# Patient Record
Sex: Female | Born: 1955 | Race: White | Hispanic: No | Marital: Married | Smoking: Current every day smoker
Health system: Southern US, Community
[De-identification: ages and names within clinical notes are randomized; demographics above are authoritative.]

## PROBLEM LIST (undated history)

## (undated) DIAGNOSIS — M1990 Unspecified osteoarthritis, unspecified site: Secondary | ICD-10-CM

## (undated) DIAGNOSIS — R011 Cardiac murmur, unspecified: Secondary | ICD-10-CM

## (undated) HISTORY — PX: CHOLECYSTECTOMY: SHX55

## (undated) HISTORY — PX: ABDOMINAL HYSTERECTOMY: SHX81

## (undated) SURGICAL SUPPLY — 35 items
BAG ZIPLOCK 12X15 (MISCELLANEOUS)
BENZOIN TINCTURE PRP APPL 2/3 (GAUZE/BANDAGES/DRESSINGS)
CLEANER TIP ELECTROSURG 2X2 (MISCELLANEOUS) ×2
DRAIN PENROSE 18X1/4 LTX STRL (WOUND CARE)
DRAPE MICROSCOPE LEICA (MISCELLANEOUS) ×2
DRAPE POUCH INSTRU U-SHP 10X18 (DRAPES) ×2
DRAPE SURG 17X11 SM STRL (DRAPES) ×2
DRSG ADAPTIC 3X8 NADH LF (GAUZE/BANDAGES/DRESSINGS) ×2
DRSG PAD ABDOMINAL 8X10 ST (GAUZE/BANDAGES/DRESSINGS) ×6
DURAPREP 26ML APPLICATOR (WOUND CARE) ×2
ELECT BLADE TIP CTD 4 INCH (ELECTRODE) ×2
ELECT REM PT RETURN 9FT ADLT (ELECTROSURGICAL) ×3
GLOVE BIOGEL PI INDICATOR 8 (GLOVE) ×2
GLOVE ECLIPSE 8.0 STRL XLNG CF (GLOVE) ×4
GLOVE SURG SS PI 8.0 STRL IVOR (GLOVE) ×2
GOWN STRL REUS W/TWL XL LVL3 (GOWN DISPOSABLE) ×4
KIT BASIN OR (CUSTOM PROCEDURE TRAY) ×2
KIT POSITIONING SURG ANDREWS (MISCELLANEOUS) ×2
MANIFOLD NEPTUNE II (INSTRUMENTS) ×2
NEEDLE SPNL 18GX3.5 QUINCKE PK (NEEDLE) ×4
NS IRRIG 1000ML POUR BTL (IV SOLUTION)
PAD ABD 8X10 STRL (GAUZE/BANDAGES/DRESSINGS) ×2
PATTIES SURGICAL .5 X.5 (GAUZE/BANDAGES/DRESSINGS)
PATTIES SURGICAL .75X.75 (GAUZE/BANDAGES/DRESSINGS)
PATTIES SURGICAL 1X1 (DISPOSABLE)
PIN SAFETY NICK PLATE  2 MED (MISCELLANEOUS)
POSITIONER SURGICAL ARM (MISCELLANEOUS) ×2
SPONGE LAP 4X18 X RAY DECT (DISPOSABLE) ×2
SPONGE SURGIFOAM ABS GEL 100 (HEMOSTASIS) ×2
STAPLER VISISTAT 35W (STAPLE) ×2
SUT VIC AB 0 CT1 27 (SUTURE) ×2
SUT VIC AB 1 CT1 27 (SUTURE) ×6
TAPE CLOTH SURG 4X10 WHT LF (GAUZE/BANDAGES/DRESSINGS) ×2
TOWEL OR 17X26 10 PK STRL BLUE (TOWEL DISPOSABLE) ×2
TRAY LAMINECTOMY (CUSTOM PROCEDURE TRAY) ×2

---

## 2011-06-19 ENCOUNTER — Ambulatory Visit (HOSPITAL_COMMUNITY): Admission: RE | Admit: 2011-06-19 | Payer: Self-pay | Source: Ambulatory Visit

## 2011-06-19 DIAGNOSIS — N281 Cyst of kidney, acquired: Secondary | ICD-10-CM | POA: Insufficient documentation

## 2011-06-19 DIAGNOSIS — K7689 Other specified diseases of liver: Secondary | ICD-10-CM | POA: Insufficient documentation

## 2011-06-19 DIAGNOSIS — M549 Dorsalgia, unspecified: Secondary | ICD-10-CM | POA: Insufficient documentation

## 2011-06-19 DIAGNOSIS — K429 Umbilical hernia without obstruction or gangrene: Secondary | ICD-10-CM | POA: Insufficient documentation

## 2011-06-19 DIAGNOSIS — Z90710 Acquired absence of both cervix and uterus: Secondary | ICD-10-CM | POA: Insufficient documentation

## 2011-06-19 DIAGNOSIS — R1031 Right lower quadrant pain: Secondary | ICD-10-CM

## 2011-06-19 DIAGNOSIS — K8020 Calculus of gallbladder without cholecystitis without obstruction: Secondary | ICD-10-CM | POA: Insufficient documentation

## 2011-06-19 DIAGNOSIS — I728 Aneurysm of other specified arteries: Secondary | ICD-10-CM | POA: Insufficient documentation

## 2011-06-19 MED ORDER — IOHEXOL 300 MG/ML  SOLN
100.0000 mL | Freq: Once | INTRAMUSCULAR | Status: AC | PRN
  Administered 2011-06-19: 100 mL via INTRAVENOUS

## 2011-06-22 ENCOUNTER — Encounter (INDEPENDENT_AMBULATORY_CARE_PROVIDER_SITE_OTHER): Payer: Self-pay | Admitting: General Surgery

## 2011-06-22 VITALS — BP 172/99 | HR 98 | Ht 63.0 in | Wt 252.0 lb

## 2011-06-22 DIAGNOSIS — K8010 Calculus of gallbladder with chronic cholecystitis without obstruction: Secondary | ICD-10-CM

## 2011-07-01 ENCOUNTER — Ambulatory Visit (INDEPENDENT_AMBULATORY_CARE_PROVIDER_SITE_OTHER): Payer: Managed Care, Other (non HMO) | Admitting: General Surgery

## 2011-07-15 ENCOUNTER — Other Ambulatory Visit (HOSPITAL_COMMUNITY): Payer: Managed Care, Other (non HMO)

## 2011-07-15 ENCOUNTER — Other Ambulatory Visit (INDEPENDENT_AMBULATORY_CARE_PROVIDER_SITE_OTHER): Payer: Self-pay | Admitting: General Surgery

## 2011-07-15 ENCOUNTER — Ambulatory Visit (HOSPITAL_COMMUNITY)
Admission: RE | Admit: 2011-07-15 | Discharge: 2011-07-15 | Disposition: A | Discharge status: Home / Self Care | Payer: Managed Care, Other (non HMO) | Source: Ambulatory Visit | Attending: General Surgery | Admitting: General Surgery

## 2011-07-15 ENCOUNTER — Encounter (HOSPITAL_COMMUNITY): Payer: Managed Care, Other (non HMO)

## 2011-07-15 DIAGNOSIS — Z01812 Encounter for preprocedural laboratory examination: Secondary | ICD-10-CM | POA: Insufficient documentation

## 2011-07-15 DIAGNOSIS — K8020 Calculus of gallbladder without cholecystitis without obstruction: Secondary | ICD-10-CM | POA: Insufficient documentation

## 2011-07-15 DIAGNOSIS — Z01818 Encounter for other preprocedural examination: Secondary | ICD-10-CM | POA: Insufficient documentation

## 2011-07-15 DIAGNOSIS — Z01810 Encounter for preprocedural cardiovascular examination: Secondary | ICD-10-CM | POA: Insufficient documentation

## 2011-07-15 DIAGNOSIS — Z9089 Acquired absence of other organs: Secondary | ICD-10-CM | POA: Insufficient documentation

## 2011-07-21 ENCOUNTER — Other Ambulatory Visit (INDEPENDENT_AMBULATORY_CARE_PROVIDER_SITE_OTHER): Payer: Self-pay | Admitting: General Surgery

## 2011-07-21 DIAGNOSIS — Z01810 Encounter for preprocedural cardiovascular examination: Secondary | ICD-10-CM | POA: Insufficient documentation

## 2011-07-21 DIAGNOSIS — K8010 Calculus of gallbladder with chronic cholecystitis without obstruction: Secondary | ICD-10-CM | POA: Insufficient documentation

## 2011-07-21 DIAGNOSIS — Z01812 Encounter for preprocedural laboratory examination: Secondary | ICD-10-CM | POA: Insufficient documentation

## 2011-07-21 DIAGNOSIS — Z90710 Acquired absence of both cervix and uterus: Secondary | ICD-10-CM | POA: Insufficient documentation

## 2011-07-21 DIAGNOSIS — Z9079 Acquired absence of other genital organ(s): Secondary | ICD-10-CM | POA: Insufficient documentation

## 2011-07-21 DIAGNOSIS — F17200 Nicotine dependence, unspecified, uncomplicated: Secondary | ICD-10-CM | POA: Insufficient documentation

## 2011-07-28 ENCOUNTER — Encounter (INDEPENDENT_AMBULATORY_CARE_PROVIDER_SITE_OTHER): Payer: Managed Care, Other (non HMO)

## 2011-07-28 ENCOUNTER — Telehealth (INDEPENDENT_AMBULATORY_CARE_PROVIDER_SITE_OTHER): Payer: Self-pay | Admitting: General Surgery

## 2011-08-09 ENCOUNTER — Encounter (INDEPENDENT_AMBULATORY_CARE_PROVIDER_SITE_OTHER): Payer: Self-pay | Admitting: General Surgery

## 2011-08-09 VITALS — BP 164/98 | HR 84 | Temp 98.0°F | Resp 18 | Ht 63.0 in | Wt 251.4 lb

## 2011-08-09 DIAGNOSIS — Z9889 Other specified postprocedural states: Secondary | ICD-10-CM

## 2014-05-14 ENCOUNTER — Other Ambulatory Visit: Payer: Self-pay | Admitting: Surgical

## 2014-05-22 ENCOUNTER — Encounter (HOSPITAL_COMMUNITY)
Admission: RE | Admit: 2014-05-22 | Discharge: 2014-05-22 | Disposition: A | Discharge status: Home / Self Care | Payer: Managed Care, Other (non HMO) | Source: Ambulatory Visit | Attending: Orthopedic Surgery | Admitting: Orthopedic Surgery

## 2014-05-22 ENCOUNTER — Ambulatory Visit (HOSPITAL_COMMUNITY)
Admission: RE | Admit: 2014-05-22 | Discharge: 2014-05-22 | Disposition: A | Discharge status: Home / Self Care | Payer: Managed Care, Other (non HMO) | Source: Ambulatory Visit | Attending: Surgical | Admitting: Surgical

## 2014-05-22 ENCOUNTER — Encounter (INDEPENDENT_AMBULATORY_CARE_PROVIDER_SITE_OTHER): Payer: Self-pay

## 2014-05-22 ENCOUNTER — Encounter (HOSPITAL_COMMUNITY): Payer: Self-pay

## 2014-05-22 DIAGNOSIS — M545 Low back pain, unspecified: Secondary | ICD-10-CM | POA: Insufficient documentation

## 2014-05-22 DIAGNOSIS — M47817 Spondylosis without myelopathy or radiculopathy, lumbosacral region: Secondary | ICD-10-CM | POA: Insufficient documentation

## 2014-05-22 DIAGNOSIS — Z01812 Encounter for preprocedural laboratory examination: Secondary | ICD-10-CM | POA: Insufficient documentation

## 2014-05-22 DIAGNOSIS — I700 Atherosclerosis of aorta: Secondary | ICD-10-CM | POA: Insufficient documentation

## 2014-05-22 DIAGNOSIS — Z01818 Encounter for other preprocedural examination: Secondary | ICD-10-CM | POA: Insufficient documentation

## 2014-05-22 HISTORY — DX: Unspecified osteoarthritis, unspecified site: M19.90

## 2014-05-22 HISTORY — DX: Cardiac murmur, unspecified: R01.1

## 2014-05-29 ENCOUNTER — Ambulatory Visit (HOSPITAL_COMMUNITY): Payer: Managed Care, Other (non HMO) | Admitting: Certified Registered Nurse Anesthetist

## 2014-05-29 ENCOUNTER — Ambulatory Visit (HOSPITAL_COMMUNITY): Payer: Managed Care, Other (non HMO)

## 2014-05-29 ENCOUNTER — Encounter (HOSPITAL_COMMUNITY)
Admission: RE | Disposition: A | Discharge status: Home / Self Care | Payer: Self-pay | Source: Ambulatory Visit | Attending: Orthopedic Surgery

## 2014-05-29 ENCOUNTER — Encounter (HOSPITAL_COMMUNITY): Payer: Self-pay | Admitting: *Deleted

## 2014-05-29 ENCOUNTER — Encounter (HOSPITAL_COMMUNITY): Payer: Managed Care, Other (non HMO) | Admitting: Certified Registered Nurse Anesthetist

## 2014-05-29 DIAGNOSIS — M069 Rheumatoid arthritis, unspecified: Secondary | ICD-10-CM | POA: Insufficient documentation

## 2014-05-29 DIAGNOSIS — F17200 Nicotine dependence, unspecified, uncomplicated: Secondary | ICD-10-CM | POA: Insufficient documentation

## 2014-05-29 DIAGNOSIS — M5126 Other intervertebral disc displacement, lumbar region: Secondary | ICD-10-CM

## 2014-05-29 HISTORY — PX: HEMI-MICRODISCECTOMY LUMBAR LAMINECTOMY LEVEL 1: SHX5846

## 2014-05-29 MED ORDER — GLYCOPYRROLATE 0.2 MG/ML IJ SOLN
INTRAMUSCULAR | Status: DC | PRN
  Administered 2014-05-29: .1 mg via INTRAVENOUS

## 2014-05-29 MED ORDER — POLYETHYLENE GLYCOL 3350 17 G PO PACK: 17.0000 g | PACK | Freq: Every day | ORAL | Status: DC | PRN

## 2014-05-29 MED ORDER — PHENOL 1.4 % MT LIQD: 1.0000 | OROMUCOSAL | Status: DC | PRN

## 2014-05-29 MED ORDER — SUCCINYLCHOLINE CHLORIDE 20 MG/ML IJ SOLN
INTRAMUSCULAR | Status: DC | PRN
  Administered 2014-05-29: 100 mg via INTRAVENOUS

## 2014-05-29 MED ORDER — THROMBIN 5000 UNITS EX SOLR
OROMUCOSAL | Status: DC | PRN
  Administered 2014-05-29: 08:00:00 via TOPICAL

## 2014-05-29 MED ORDER — FENTANYL CITRATE 0.05 MG/ML IJ SOLN: INTRAMUSCULAR | Status: AC

## 2014-05-29 MED ORDER — ACETAMINOPHEN 10 MG/ML IV SOLN: 1000.0000 mg | Freq: Once | INTRAVENOUS | Status: DC

## 2014-05-29 MED ORDER — DEXAMETHASONE SODIUM PHOSPHATE 10 MG/ML IJ SOLN: INTRAMUSCULAR | Status: AC

## 2014-05-29 MED ORDER — HYDROMORPHONE HCL PF 1 MG/ML IJ SOLN: INTRAMUSCULAR | Status: AC

## 2014-05-29 MED ORDER — ATROPINE SULFATE 0.4 MG/ML IJ SOLN: INTRAMUSCULAR | Status: AC

## 2014-05-29 MED ORDER — OXYCODONE-ACETAMINOPHEN 5-325 MG PO TABS
1.0000 | ORAL_TABLET | ORAL | Status: DC | PRN
  Administered 2014-05-29: 1 via ORAL

## 2014-05-29 MED ORDER — LACTATED RINGERS IV SOLN
INTRAVENOUS | Status: DC
  Administered 2014-05-29 (×3): via INTRAVENOUS

## 2014-05-29 MED ORDER — EPHEDRINE SULFATE 50 MG/ML IJ SOLN: INTRAMUSCULAR | Status: AC

## 2014-05-29 MED ORDER — BACITRACIN-NEOMYCIN-POLYMYXIN 400-5-5000 EX OINT
TOPICAL_OINTMENT | CUTANEOUS | Status: DC | PRN
  Administered 2014-05-29: 1 via TOPICAL

## 2014-05-29 MED ORDER — CISATRACURIUM BESYLATE 20 MG/10ML IV SOLN: INTRAVENOUS | Status: AC

## 2014-05-29 MED ORDER — MIDAZOLAM HCL 2 MG/2ML IJ SOLN: INTRAMUSCULAR | Status: AC

## 2014-05-29 MED ORDER — FLEET ENEMA 7-19 GM/118ML RE ENEM: 1.0000 | ENEMA | Freq: Once | RECTAL | Status: AC | PRN

## 2014-05-29 MED ORDER — NEOSTIGMINE METHYLSULFATE 10 MG/10ML IV SOLN: INTRAVENOUS | Status: AC

## 2014-05-29 MED ORDER — ONDANSETRON HCL 4 MG/2ML IJ SOLN: 4.0000 mg | INTRAMUSCULAR | Status: DC | PRN

## 2014-05-29 MED ORDER — ACETAMINOPHEN 650 MG RE SUPP: 650.0000 mg | RECTAL | Status: DC | PRN

## 2014-05-29 MED ORDER — SODIUM CHLORIDE 0.9 % IR SOLN
Status: DC | PRN
  Administered 2014-05-29: 08:00:00

## 2014-05-29 MED ORDER — BUPIVACAINE-EPINEPHRINE (PF) 0.5% -1:200000 IJ SOLN: INTRAMUSCULAR | Status: AC

## 2014-05-29 MED ORDER — ESMOLOL HCL 10 MG/ML IV SOLN
INTRAVENOUS | Status: DC | PRN
  Administered 2014-05-29: 5 mg via INTRAVENOUS

## 2014-05-29 MED ORDER — BISACODYL 5 MG PO TBEC: 5.0000 mg | DELAYED_RELEASE_TABLET | Freq: Every day | ORAL | Status: DC | PRN

## 2014-05-29 MED ORDER — CISATRACURIUM BESYLATE (PF) 10 MG/5ML IV SOLN
INTRAVENOUS | Status: DC | PRN
  Administered 2014-05-29: 8 mg via INTRAVENOUS

## 2014-05-29 MED ORDER — MIDAZOLAM HCL 5 MG/5ML IJ SOLN
INTRAMUSCULAR | Status: DC | PRN
  Administered 2014-05-29 (×2): 1 mg via INTRAVENOUS

## 2014-05-29 MED ORDER — CEFAZOLIN SODIUM-DEXTROSE 2-3 GM-% IV SOLR
2.0000 g | INTRAVENOUS | Status: AC
  Administered 2014-05-29: 2 g via INTRAVENOUS

## 2014-05-29 MED ORDER — BACITRACIN-NEOMYCIN-POLYMYXIN 400-5-5000 EX OINT: TOPICAL_OINTMENT | CUTANEOUS | Status: AC

## 2014-05-29 MED ORDER — PHENYLEPHRINE HCL 10 MG/ML IJ SOLN
INTRAMUSCULAR | Status: DC | PRN
  Administered 2014-05-29: 80 ug via INTRAVENOUS
  Administered 2014-05-29: 40 ug via INTRAVENOUS
  Administered 2014-05-29: 80 ug via INTRAVENOUS

## 2014-05-29 MED ORDER — HYDROMORPHONE HCL PF 1 MG/ML IJ SOLN: 0.5000 mg | INTRAMUSCULAR | Status: DC | PRN

## 2014-05-29 MED ORDER — PROMETHAZINE HCL 25 MG/ML IJ SOLN: 6.2500 mg | INTRAMUSCULAR | Status: DC | PRN

## 2014-05-29 MED ORDER — ONDANSETRON HCL 4 MG/2ML IJ SOLN
INTRAMUSCULAR | Status: DC | PRN
  Administered 2014-05-29 (×2): 2 mg via INTRAVENOUS

## 2014-05-29 MED ORDER — MENTHOL 3 MG MT LOZG: 1.0000 | LOZENGE | OROMUCOSAL | Status: DC | PRN

## 2014-05-29 MED ORDER — SODIUM CHLORIDE 0.9 % IV SOLN: 10.0000 mg | INTRAVENOUS | Status: DC | PRN

## 2014-05-29 MED ORDER — HYDROMORPHONE HCL PF 1 MG/ML IJ SOLN
0.2500 mg | INTRAMUSCULAR | Status: DC | PRN
  Administered 2014-05-29 (×3): 0.5 mg via INTRAVENOUS

## 2014-05-29 MED ORDER — NEOSTIGMINE METHYLSULFATE 10 MG/10ML IV SOLN
INTRAVENOUS | Status: DC | PRN
  Administered 2014-05-29: 4 mg via INTRAVENOUS

## 2014-05-29 MED ORDER — METHOCARBAMOL 500 MG PO TABS
500.0000 mg | ORAL_TABLET | Freq: Four times a day (QID) | ORAL | Status: DC | PRN
  Administered 2014-05-30: 500 mg via ORAL

## 2014-05-29 MED ORDER — ACETAMINOPHEN 325 MG PO TABS: 650.0000 mg | ORAL_TABLET | ORAL | Status: DC | PRN

## 2014-05-29 MED ORDER — BUPIVACAINE LIPOSOME 1.3 % IJ SUSP
20.0000 mL | Freq: Once | INTRAMUSCULAR | Status: AC
  Administered 2014-05-29: 20 mL

## 2014-05-29 MED ORDER — LACTATED RINGERS IV SOLN
INTRAVENOUS | Status: DC
  Administered 2014-05-29: 100 mL/h via INTRAVENOUS

## 2014-05-29 MED ORDER — ONDANSETRON HCL 4 MG/2ML IJ SOLN: INTRAMUSCULAR | Status: AC

## 2014-05-29 MED ORDER — GLYCOPYRROLATE 0.2 MG/ML IJ SOLN: INTRAMUSCULAR | Status: AC

## 2014-05-29 MED ORDER — METHOCARBAMOL 1000 MG/10ML IJ SOLN
500.0000 mg | Freq: Four times a day (QID) | INTRAVENOUS | Status: DC | PRN
  Administered 2014-05-29: 500 mg via INTRAVENOUS

## 2014-05-29 MED ORDER — PROPOFOL 10 MG/ML IV BOLUS
INTRAVENOUS | Status: DC | PRN
  Administered 2014-05-29: 180 mg via INTRAVENOUS
  Administered 2014-05-29: 20 mg via INTRAVENOUS

## 2014-05-29 MED ORDER — LIDOCAINE HCL (CARDIAC) 20 MG/ML IV SOLN: INTRAVENOUS | Status: AC

## 2014-05-29 MED ORDER — THROMBIN 5000 UNITS EX SOLR: CUTANEOUS | Status: AC

## 2014-05-29 MED ORDER — THROMBIN 5000 UNITS EX SOLR
CUTANEOUS | Status: DC | PRN
  Administered 2014-05-29: 5000 [IU] via TOPICAL

## 2014-05-29 MED ORDER — LIDOCAINE HCL (CARDIAC) 20 MG/ML IV SOLN
INTRAVENOUS | Status: DC | PRN
  Administered 2014-05-29: 100 mg via INTRAVENOUS

## 2014-05-29 MED ORDER — ACETAMINOPHEN 10 MG/ML IV SOLN
INTRAVENOUS | Status: DC | PRN
  Administered 2014-05-29: 1000 mg via INTRAVENOUS

## 2014-05-29 MED ORDER — DEXAMETHASONE SODIUM PHOSPHATE 10 MG/ML IJ SOLN
INTRAMUSCULAR | Status: DC | PRN
  Administered 2014-05-29: 10 mg via INTRAVENOUS

## 2014-05-29 MED ORDER — OXYCODONE-ACETAMINOPHEN 5-325 MG PO TABS
1.0000 | ORAL_TABLET | ORAL | Status: DC | PRN
  Administered 2014-05-29 – 2014-05-30 (×3): 2 via ORAL

## 2014-05-29 MED ORDER — PHENYLEPHRINE HCL 10 MG/ML IJ SOLN: INTRAMUSCULAR | Status: AC

## 2014-05-29 MED ORDER — CEFAZOLIN SODIUM 1-5 GM-% IV SOLN
1.0000 g | Freq: Three times a day (TID) | INTRAVENOUS | Status: AC
  Administered 2014-05-29 – 2014-05-30 (×3): 1 g via INTRAVENOUS

## 2014-05-29 MED ORDER — FENTANYL CITRATE 0.05 MG/ML IJ SOLN
INTRAMUSCULAR | Status: DC | PRN
  Administered 2014-05-29: 150 ug via INTRAVENOUS
  Administered 2014-05-29 (×2): 50 ug via INTRAVENOUS

## 2014-05-30 ENCOUNTER — Encounter (HOSPITAL_COMMUNITY): Payer: Self-pay | Admitting: Orthopedic Surgery

## 2014-05-30 MED ORDER — OXYCODONE-ACETAMINOPHEN 5-325 MG PO TABS: 1.0000 | ORAL_TABLET | ORAL | Status: AC | PRN

## 2016-03-21 IMAGING — DX DG SPINE 1V PORT
1 series · 1 of 1 positions shown · non-contrast
Comparison: 05/29/2014 at [DATE] a.m. ; 05/22/2014

CLINICAL DATA: Intraoperative image 2.  L4-5 laminectomy.

EXAM:
PORTABLE SPINE - 1 VIEW

[l-spine x-table]
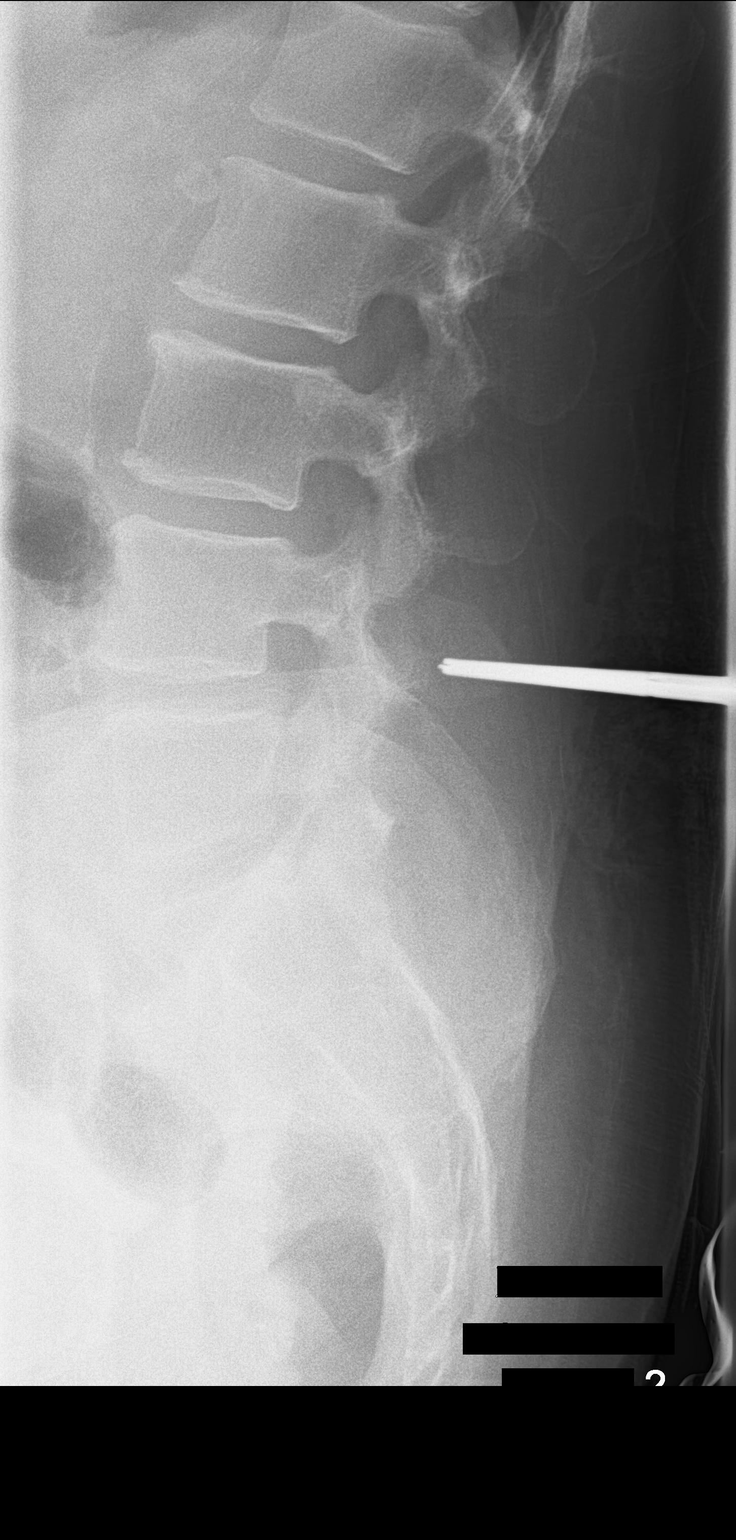

[1 of 1 positions shown; findings below may reference images not displayed]

FINDINGS: Correlation with prior imaging or fills that there are 5 lumbar type
non-rib-bearing vertebra. The lowest of these is labeled L5.

The probe is present with its tip projecting over the L4 spinous
process, oriented to the posterior inferior margin of the L4
vertebra.

Aortoiliac atherosclerotic vascular disease.
IMPRESSION: 1. Probe localization of the L4 spinous process.
